# Patient Record
Sex: Male | Born: 1980 | Race: White | Hispanic: No | Marital: Single | Smoking: Former smoker
Health system: Southern US, Community
[De-identification: ages and names within clinical notes are randomized; demographics above are authoritative.]

## PROBLEM LIST (undated history)

## (undated) DIAGNOSIS — R079 Chest pain, unspecified: Secondary | ICD-10-CM

## (undated) DIAGNOSIS — M549 Dorsalgia, unspecified: Secondary | ICD-10-CM

## (undated) DIAGNOSIS — E663 Overweight: Secondary | ICD-10-CM

## (undated) DIAGNOSIS — N182 Chronic kidney disease, stage 2 (mild): Secondary | ICD-10-CM

## (undated) DIAGNOSIS — R0602 Shortness of breath: Secondary | ICD-10-CM

## (undated) HISTORY — DX: Chronic kidney disease, stage 2 (mild): N18.2

## (undated) HISTORY — DX: Overweight: E66.3

## (undated) HISTORY — DX: Dorsalgia, unspecified: M54.9

## (undated) HISTORY — DX: Chest pain, unspecified: R07.9

## (undated) HISTORY — DX: Shortness of breath: R06.02

---

## 2005-08-14 ENCOUNTER — Emergency Department (HOSPITAL_COMMUNITY): Admission: EM | Admit: 2005-08-14 | Discharge: 2005-08-14 | Payer: Self-pay | Admitting: Emergency Medicine

## 2007-02-08 IMAGING — CR DG HAND COMPLETE 3+V*R*
5 series · 5 of 5 positions shown · non-contrast
Comparison: none

CLINICAL DATA: Hand injury.  Hit something.
 RIGHT HAND ? 3 VIEWS:

[view not recorded (1 of 5)]
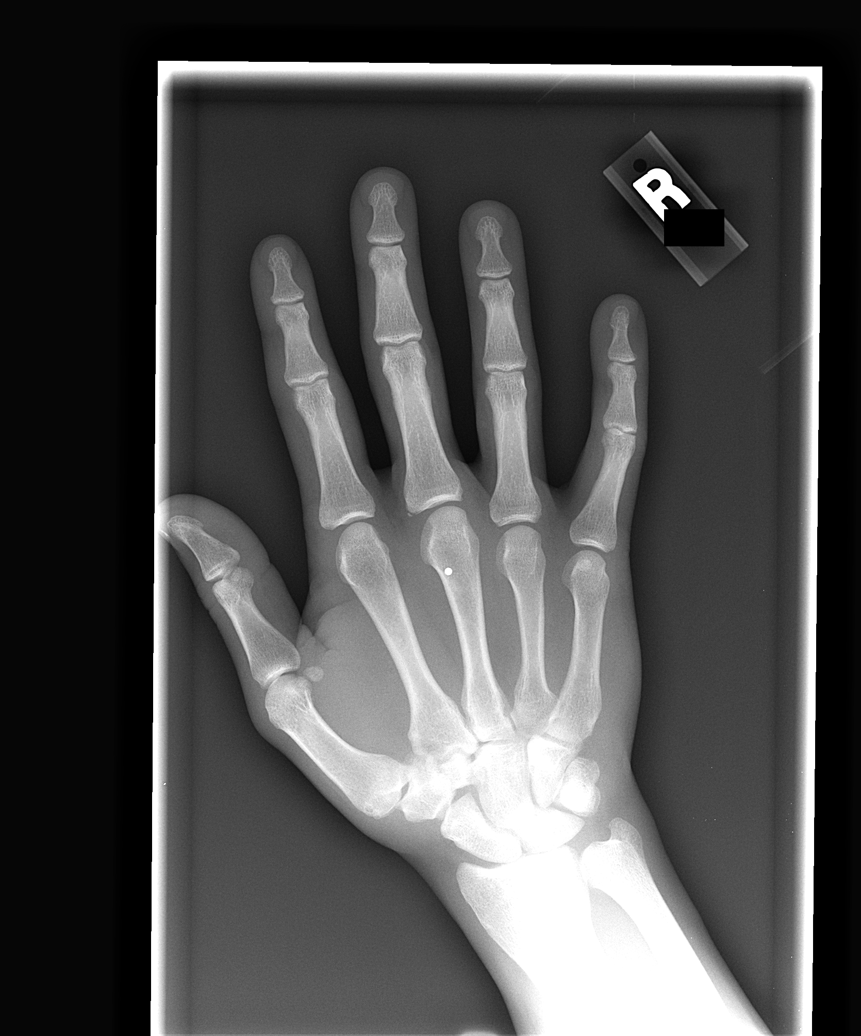

[view not recorded (2 of 5)]
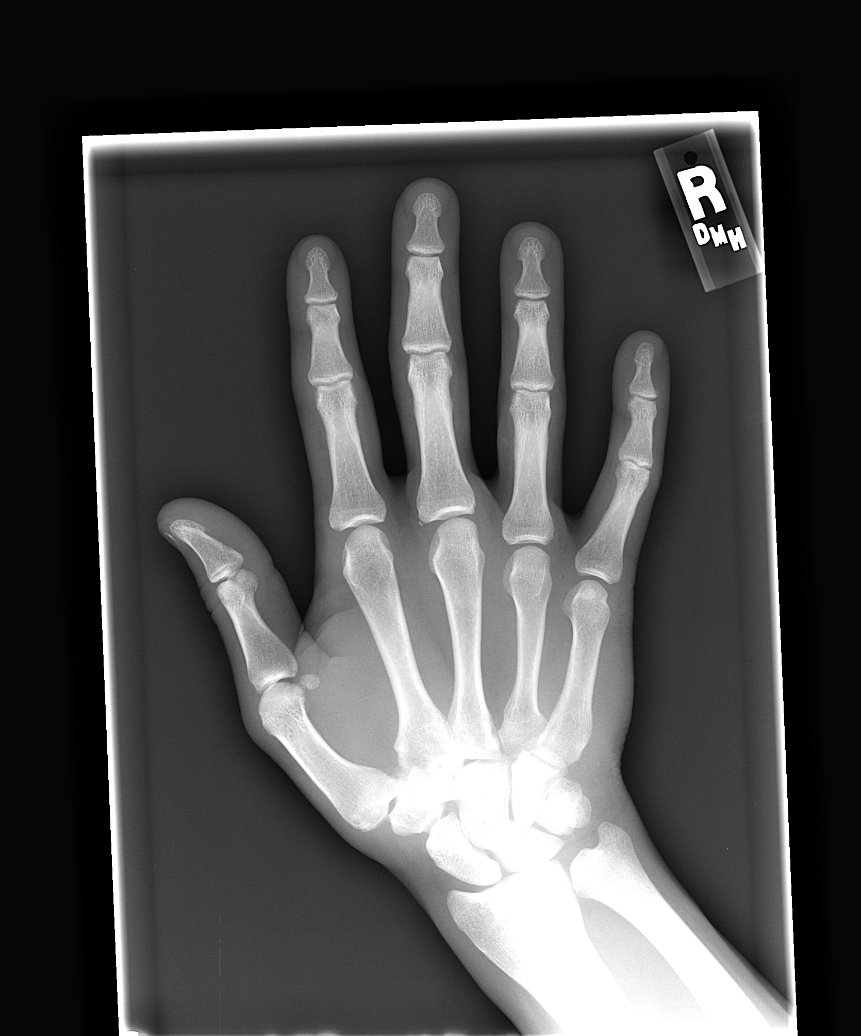

[view not recorded (3 of 5)]
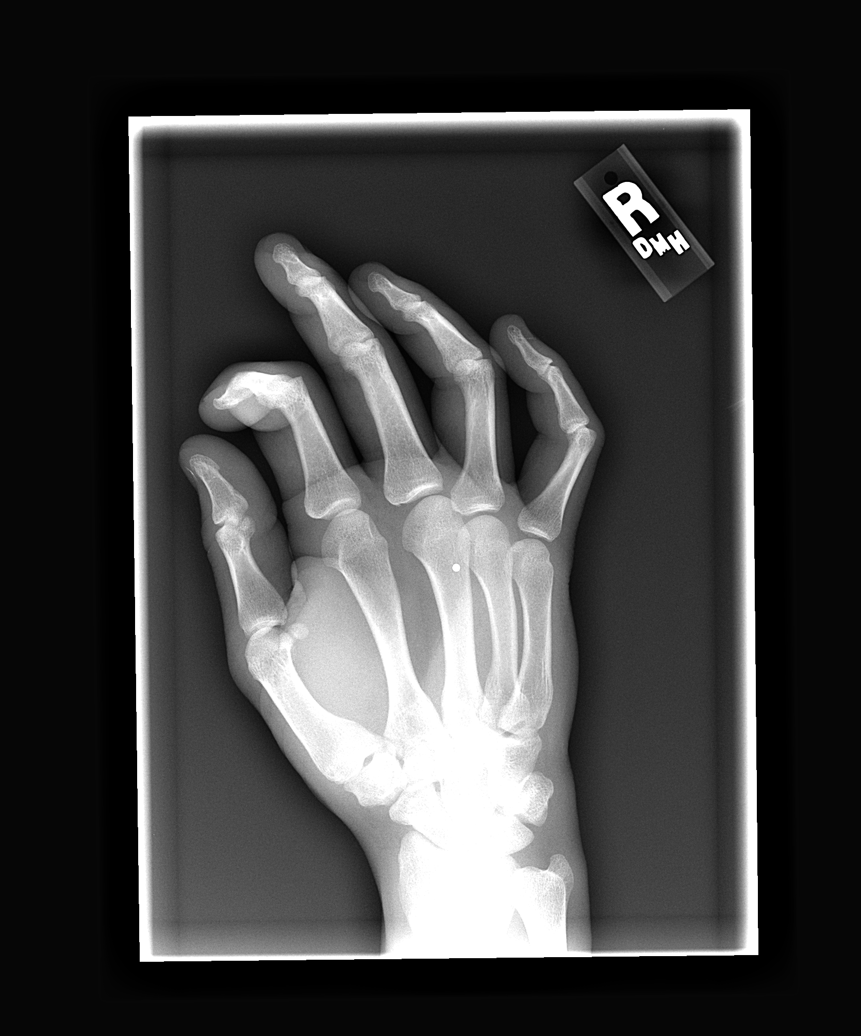

[view not recorded (4 of 5)]
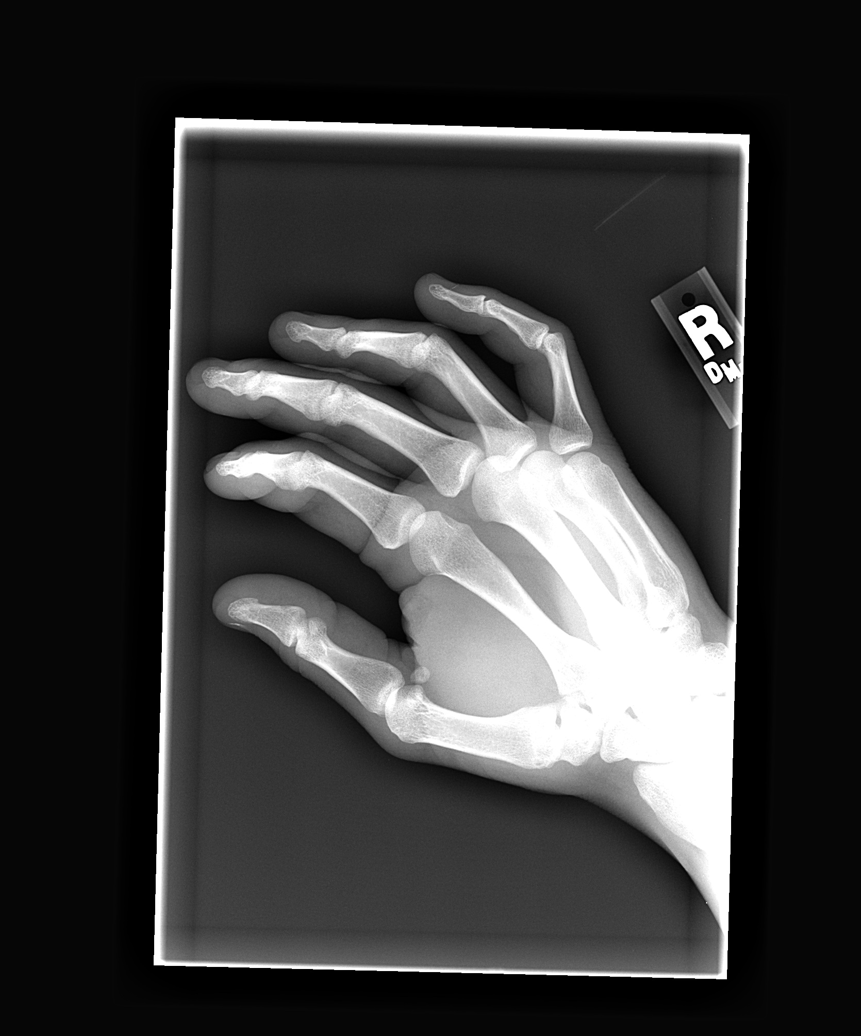

[view not recorded (5 of 5)]
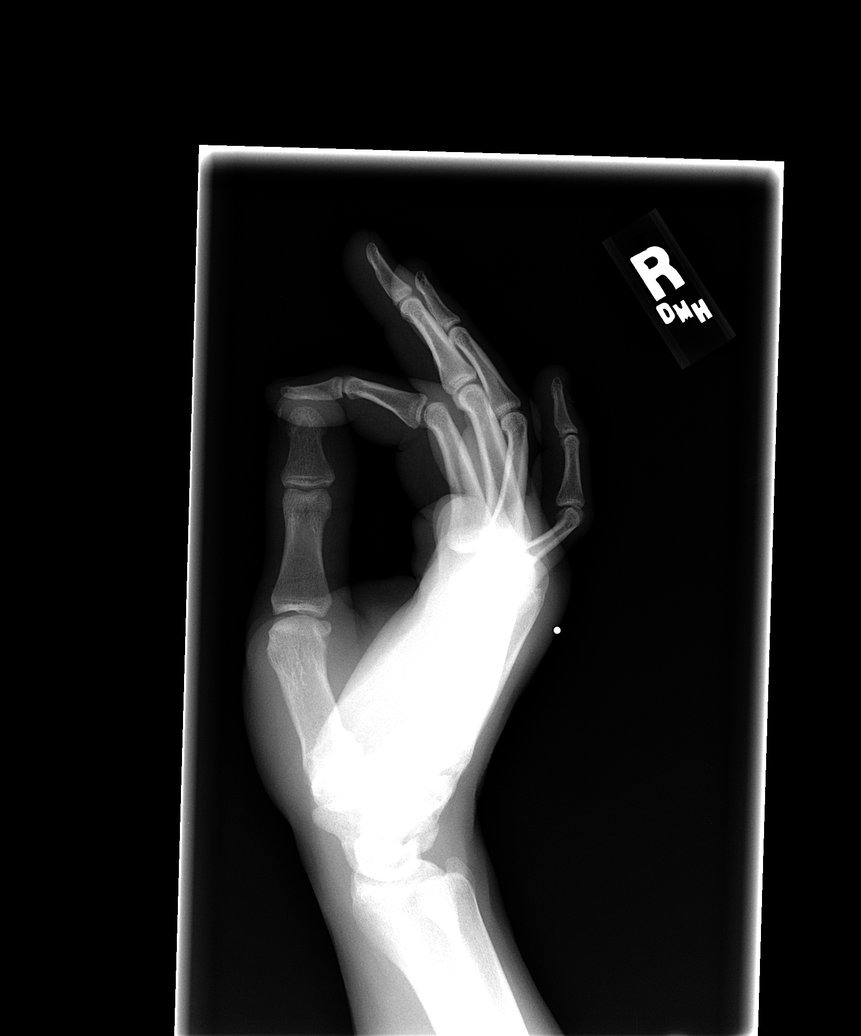

[5 of 5 positions shown; findings below may reference images not displayed]

FINDINGS: There is a small 1 mm avulsion fracture at the base of the third proximal phalanx.  No other fracture is seen.  A BB overlying the third metacarpal is a marker for the study and not a foreign body.
IMPRESSION: Small avulsion fracture at the base of the third proximal phalanx.

## 2021-11-26 DIAGNOSIS — Z789 Other specified health status: Secondary | ICD-10-CM | POA: Insufficient documentation

## 2021-11-26 HISTORY — DX: Other specified health status: Z78.9

## 2021-12-01 ENCOUNTER — Ambulatory Visit: Payer: Self-pay | Admitting: Cardiology

## 2022-03-16 ENCOUNTER — Ambulatory Visit (INDEPENDENT_AMBULATORY_CARE_PROVIDER_SITE_OTHER): Payer: Self-pay | Admitting: Internal Medicine

## 2022-03-16 ENCOUNTER — Encounter: Payer: Self-pay | Admitting: Internal Medicine

## 2022-03-16 ENCOUNTER — Ambulatory Visit (INDEPENDENT_AMBULATORY_CARE_PROVIDER_SITE_OTHER): Payer: Self-pay

## 2022-03-16 VITALS — BP 112/68 | HR 70 | Ht 72.0 in | Wt 216.0 lb

## 2022-03-16 DIAGNOSIS — I493 Ventricular premature depolarization: Secondary | ICD-10-CM

## 2022-03-16 DIAGNOSIS — R030 Elevated blood-pressure reading, without diagnosis of hypertension: Secondary | ICD-10-CM | POA: Insufficient documentation

## 2022-03-16 DIAGNOSIS — R002 Palpitations: Secondary | ICD-10-CM

## 2022-03-16 LAB — TSH: TSH: 2.21 u[IU]/mL (ref 0.450–4.500)

## 2022-03-16 NOTE — Addendum Note (Signed)
Addended by: Macie Burows on: 03/16/2022 10:44 AM   Modules accepted: Orders

## 2022-03-16 NOTE — Progress Notes (Signed)
Cardiology Office Note:    Date:  03/16/2022   ID:  Mark Marquez, DOB [birth year], MRN [id number]  PCP:  Meda Coffee, FNP   [facility] Providers Cardiologist:  Christell Constant, MD     Referring MD: Meda Coffee, FNP   CC: Palpitations Consulted for the evaluation of chest pain at the behest of Ms. Gobush FNP  History of Present Illness:    Mark Marquez is a 41 y.o. 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   Was last feeling well back in 2017 Started having chest pain and fatigue: stress test was normal. Notes that he has had a generalized sick feeling. Has been dealing with migraines; they are getting worse.    Has not had recent chest pain. 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  Notes SOB with activity: walking up a small incline and felt out of breath.  No PND or orthopnea.  No weight gain, leg swelling , or abdominal swelling.  No syncope or near syncope .   Has irregular heart beats on AMB BP cuff that always reads high. Asymptomatic from PVCs  Patient reports prior cardiac testing including negative stress test in 2019; saw Cardiology (Dr. Daleen Squibb) in 2021.   Past Medical History:  Diagnosis Date   Back pain    Chest pain    No pertinent past medical history 11/26/2021   Overweight    SOB (shortness of breath)     History reviewed. No pertinent surgical history.  Current Medications: Current Meds  Medication Sig   [DISCONTINUED] cyclobenzaprine (FLEXERIL) 5 MG tablet Take 5 mg by mouth 3 (three) times daily as needed for muscle spasms.   [DISCONTINUED] meloxicam (MOBIC) 7.5 MG tablet Take 7.5 mg by mouth 2 (two) times daily.     Allergies:   Patient has no known allergies.   Social History   Socioeconomic History   Marital status: Single    Spouse name: Not  on file   Number of children: Not on file   Years of education: Not on file   Highest education level: Not on file  Occupational History   Not on file  Tobacco Use   Smoking status: Former    Types: Cigarettes   Smokeless tobacco: Never  Substance and Sexual Activity   Alcohol use: Not Currently   Drug use: Never   Sexual activity: Yes  Other Topics Concern   Not on file  Social History Narrative   Not on file   Social Determinants of Health   Financial Resource Strain: Not on file  Food Insecurity: Not on file  Transportation Needs: Not on file  Physical Activity: Not on file  Stress: Not on file  Social Connections: Not on file    Social: Works remodeling house; 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     All other systems reviewed and are negative.  EKGs/Labs/Other Studies Reviewed:    The following studies were reviewed today:  EKG:  EKG is  ordered today.  The ekg ordered today demonstrates  03/16/22: SR rate 70, no evidence of inferior infarct;  Recent Labs: No results found for requested labs within last  365 days.  Recent Lipid Panel No results found for: "CHOL", "TRIG", "HDL", "CHOLHDL", "VLDL", "LDLCALC", "LDLDIRECT"  Outside Hospital  or Outside Clinic Studies (OSH):  Date: 03/15/22 LDL 86 Creatinine 1.1      Physical Exam:    VS:  BP 112/68   Pulse 70   Ht 6' (1.829 m)   Wt 216 lb (98 kg)   SpO2 98%   BMI 29.29 kg/m     Wt Readings from Last 3 Encounters:  03/16/22 216 lb (98 kg)    Gen: no distress   Neck: No JVD Cardiac: No Rubs or Gallops, no Murmur, IRIR +2 radial pulses Respiratory: Clear to auscultation bilaterally, normal respiratory rate and effort GI: Soft, nontender, non-distended  MS: No  edema;  moves all extremities Integument: Skin feels warm Neuro:  At time of evaluation, alert and  oriented to person/place/time/situation  Psych: Normal affect, patient feels well  ASSESSMENT:    1. PVC (premature ventricular contraction)   2. Palpitations   3. PVC's (premature ventricular contractions)   4. Elevated blood pressure reading    PLAN:    Palpitations History of PVCs Masked HTN vs migraines and elevated BP reading - suspect he has having at least occasional PVCs; 7 day non live Heart monitor - will get echo; will have him bring his cuff on day of echo to compare BPs - Check TSH - based on results we will offer PRN AV nodal agents or daily meds for BP  APP this winter unless LV dysfunction       Medication Adjustments/Labs and Tests Ordered: Current medicines are reviewed at length with the patient today.  Concerns regarding medicines are outlined above.  Orders Placed This Encounter  Procedures   TSH   LONG TERM MONITOR (3-14 DAYS)   EKG 12-Lead   ECHOCARDIOGRAM COMPLETE   No orders of the defined types were placed in this encounter.   Patient Instructions  Medication Instructions:  Your physician recommends that you continue on your current medications as directed. Please refer to the Current Medication list given to you today.  *If you need a refill on your cardiac medications before your next appointment, please call your pharmacy*   Lab Work: TODAY: TSH If you have labs (blood work) drawn today and your tests are completely normal, you will receive your results only by: MyChart Message (if you have MyChart) OR A paper copy in the mail If you have any lab test that is abnormal or we need to change your treatment, we will call you to review the results.   Testing/Procedures: Your physician has requested that you have an echocardiogram. Echocardiography is a painless test that uses sound waves to create images of your heart. It provides your doctor with information about the size and shape of your heart and how well your heart's chambers and  valves are working. This procedure takes approximately one hour. There are no restrictions for this procedure.    Follow-Up: At [facility], you and your health needs are our priority.  As part of our continuing mission to provide you with exceptional heart care, we have created designated Provider Care Teams.  These Care Teams include your primary Cardiologist (physician) and Advanced Practice Providers (APPs -  Physician Assistants and Nurse Practitioners) who all work together to provide you with the care you need, when you need it.  We recommend signing up for the patient portal called "MyChart".  Sign up information is provided on this After Visit  Summary.  MyChart is used to connect with patients for Virtual Visits (Telemedicine).  Patients are able to view lab/test results, encounter notes, upcoming appointments, etc.  Non-urgent messages can be sent to your provider as well.   To learn more about what you can do with MyChart, go to [email or web address].    Your next appointment:   7 month(s)  The format for your next appointment:   In Person  Provider:   Ronie Spies, PA-C, Jacolyn Reedy, PA-C, or Eligha Bridegroom, NP       Other Instructions Please bring Blood Pressure readings to your Echo appointment for MD to review  Important Information About Sugar         Signed, Christell Constant, MD  03/16/2022 10:27 AM    [facility]

## 2022-03-16 NOTE — Patient Instructions (Addendum)
Medication Instructions:  Your physician recommends that you continue on your current medications as directed. Please refer to the Current Medication list given to you today.  *If you need a refill on your cardiac medications before your next appointment, please call your pharmacy*   Lab Work: TODAY: TSH If you have labs (blood work) drawn today and your tests are completely normal, you will receive your results only by: MyChart Message (if you have MyChart) OR A paper copy in the mail If you have any lab test that is abnormal or we need to change your treatment, we will call you to review the results.   Testing/Procedures: Your physician has requested that you have an echocardiogram. Echocardiography is a painless test that uses sound waves to create images of your heart. It provides your doctor with information about the size and shape of your heart and how well your heart's chambers and valves are working. This procedure takes approximately one hour. There are no restrictions for this procedure.  Your physician has requested that you wear a 7 day heart monitor.     Follow-Up: At [facility], you and your health needs are our priority.  As part of our continuing mission to provide you with exceptional heart care, we have created designated Provider Care Teams.  These Care Teams include your primary Cardiologist (physician) and Advanced Practice Providers (APPs -  Physician Assistants and Nurse Practitioners) who all work together to provide you with the care you need, when you need it.  We recommend signing up for the patient portal called "MyChart".  Sign up information is provided on this After Visit Summary.  MyChart is used to connect with patients for Virtual Visits (Telemedicine).  Patients are able to view lab/test results, encounter notes, upcoming appointments, etc.  Non-urgent messages can be sent to your provider as well.   To learn more about what you can do with MyChart, go  to [email or web address].    Your next appointment:   7 month(s)  The format for your next appointment:   In Person  Provider:   Ronie Spies, PA-C, Mark Reedy, PA-C, or Mark Bridegroom, NP       Other Instructions Please bring Blood Pressure readings to your Echo appointment for MD to review  ZIO XT- Long Term Monitor Instructions  Your physician has requested you wear a ZIO patch monitor for 14 days.  This is a single patch monitor. [employer] supplies one patch monitor per enrollment. Additional stickers are not available. Please do not apply patch if you will be having a Nuclear Stress Test,  Echocardiogram, Cardiac CT, MRI, or Chest Xray during the period you would be wearing the  monitor. The patch cannot be worn during these tests. You cannot remove and re-apply the  ZIO XT patch monitor.  Your ZIO patch monitor will be mailed 3 day USPS to your address on file. It may take 3-5 days  to receive your monitor after you have been enrolled.  Once you have received your monitor, please review the enclosed instructions. Your monitor  has already been registered assigning a specific monitor serial # to you.  Billing and Patient Assistance Program Information  We have supplied [employer] with any of your insurance information on file for billing purposes. [employer] offers a sliding scale Patient Assistance Program for patients that do not have  insurance, or whose insurance does not completely cover the cost of the ZIO monitor.  You must apply for the Patient Assistance Program to  qualify for this discounted rate.  To apply, please call [employer] at [phone], select option 4, select option 2, ask to apply for  Patient Assistance Program. Meredeth Ide will ask your household income, and how many people  are in your household. They will quote your out-of-pocket cost based on that information.  [employer] will also be able to set up a 73-month, interest-free payment plan if needed.  Applying  the monitor   Shave hair from upper left chest.  Hold abrader disc by orange tab. Rub abrader in 40 strokes over the upper left chest as  indicated in your monitor instructions.  Clean area with 4 enclosed alcohol pads. Let dry.  Apply patch as indicated in monitor instructions. Patch will be placed under collarbone on left  side of chest with arrow pointing upward.  Rub patch adhesive wings for 2 minutes. Remove white label marked "1". Remove the white  label marked "2". Rub patch adhesive wings for 2 additional minutes.  While looking in a mirror, press and release button in center of patch. A small green light will  flash 3-4 times. This will be your only indicator that the monitor has been turned on.  Do not shower for the first 24 hours. You may shower after the first 24 hours.  Press the button if you feel a symptom. You will hear a small click. Record Date, Time and  Symptom in the Patient Logbook.  When you are ready to remove the patch, follow instructions on the last 2 pages of Patient  Logbook. Stick patch monitor onto the last page of Patient Logbook.  Place Patient Logbook in the blue and white box. Use locking tab on box and tape box closed  securely. The blue and white box has prepaid postage on it. Please place it in the mailbox as  soon as possible. Your physician should have your test results approximately 7 days after the  monitor has been mailed back to [facility].  Call [facility] Customer Care at [phone] if you have questions regarding  your ZIO XT patch monitor. Call them immediately if you see an orange light blinking on your  monitor.  If your monitor falls off in less than 4 days, contact our Monitor department at [phone].  If your monitor becomes loose or falls off after 4 days call [employer] at [phone] for  suggestions on securing your monitor    Important Information About Sugar

## 2022-03-16 NOTE — Progress Notes (Unsigned)
Enrolled for [employer] to mail a ZIO XT long term holter monitor to the patients address on file.  

## 2022-03-18 ENCOUNTER — Telehealth: Payer: Self-pay | Admitting: Internal Medicine

## 2022-03-18 NOTE — Telephone Encounter (Signed)
Pt aware of lab results ./cy Christell Constant, MD  03/18/2022 10:01 AM EDT     Testing Results WNL.  No change to diagnostic or therapeutic plan.

## 2022-03-18 NOTE — Telephone Encounter (Signed)
Follow Up:     Patient is returning Christine's call from today,concerning  his results.

## 2022-03-22 ENCOUNTER — Telehealth: Payer: Self-pay | Admitting: Licensed Clinical Social Worker

## 2022-03-22 NOTE — Progress Notes (Signed)
Mark Marquez  03/22/2022  Mark Marquez 16-Jul-1981 [id number]  Reason for Referral:  Patient is participating in a Managed Medicaid Plan: No, self pay only Engaged with patient by telephone for initial visit for Mark and Vascular Care Coordination.                                                                                                   Assessment:                    LCSW left pt a message at [phone]. Pt returned my call afterward. I introduced self, role, reason for call. Pt confirmed home address, he sees Mark Marquez at [facility] for primary care and confirmed he is uninsured. He shares that his emergency contact listed Mark Marquez) has passed, he would like to update it as Mark Marquez [phone]. He is currently working, previous job last year he was let go. Denies any specific concerns regarding cost of living. He does have interest in completing Mark Marquez application since he is going to have an echo and has had appts recently in clinic. He understands I will start application for him and then he will need to complete application further. No additional questions at this time.                      HRT/VAS Care Coordination     Patients Home Cardiology Office [facility]   Outpatient Care Team Social Worker   Social Worker Name: Mark Marquez, [place] [facility] [phone]   Living arrangements for the past 2 months Single Family Home   Lives with: Self   Patient Current Insurance Coverage Self-Pay   Patient Has Concern With Paying Medical Bills Yes   Patient Concerns With Medical Bills no insurance, ongoing medical work up   Medical Bill Referrals: [facility]   Does Patient Have Prescription Coverage? No  currently on no medications       Social History:                                                                             SDOH Screenings   Alcohol Screen: Not on file  Depression  (XBM8-4): Not on file  Financial Resource Strain: Medium Risk (03/22/2022)   Overall Financial Resource Strain (CARDIA)    Difficulty of Paying Living Expenses: Somewhat hard  Food Insecurity: No Food Insecurity (03/22/2022)   Hunger Vital Sign    Worried About Running Out of Food in the Last Year: Never true    Ran Out of Food in the Last Year: Never true  Housing: Low Risk  (03/22/2022)   Housing    Last Housing Risk Score: 0  Physical Activity: Not on file  Social Connections: Not  on file  Stress: Not on file  Tobacco Use: Medium Risk (03/16/2022)   Patient History    Smoking Tobacco Use: Former    Smokeless Tobacco Use: Never    Passive Exposure: Not on file  Transportation Needs: No Transportation Needs (03/22/2022)   PRAPARE - Administrator, Civil Service (Medical): No    Lack of Transportation (Non-Medical): No    SDOH Interventions: Financial Resources:  Financial Strain Interventions: Other (Comment) Civil engineer, contracting; [facility]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 Pt aware that I can assist him with completing that as needed and should he have any medications ordered or cost of living concerns that we remain available. I will f/u with pt to ensure it is received.

## 2022-03-23 ENCOUNTER — Ambulatory Visit (HOSPITAL_COMMUNITY): Payer: Self-pay | Attending: Cardiology

## 2022-03-23 DIAGNOSIS — R002 Palpitations: Secondary | ICD-10-CM | POA: Insufficient documentation

## 2022-03-23 DIAGNOSIS — I493 Ventricular premature depolarization: Secondary | ICD-10-CM | POA: Insufficient documentation

## 2022-03-23 LAB — ECHOCARDIOGRAM COMPLETE
Area-P 1/2: 4.49 cm2
S' Lateral: 2.9 cm

## 2022-03-24 ENCOUNTER — Telehealth: Payer: Self-pay

## 2022-03-24 ENCOUNTER — Telehealth: Payer: Self-pay | Admitting: Internal Medicine

## 2022-03-24 NOTE — Telephone Encounter (Signed)
-----   Message from Georgeanna Lea, MD sent at 03/23/2022 12:25 PM EDT ----- Echocardiogram showed preserved left ventricle ejection fraction, overall looks good

## 2022-03-24 NOTE — Telephone Encounter (Signed)
Patient notified of results.

## 2022-03-24 NOTE — Telephone Encounter (Signed)
LM to return my call. 

## 2022-03-24 NOTE — Telephone Encounter (Signed)
Pt returning nurses call regarding results. Please advise 

## 2022-04-06 ENCOUNTER — Telehealth: Payer: Self-pay | Admitting: Licensed Clinical Social Worker

## 2022-04-06 NOTE — Telephone Encounter (Signed)
H&V Care Navigation CSW Progress Note   Clinical Social Worker contacted patient by phone to f/u on assistance applications. No answer again at [phone]. Left oicemail to f/u, I remain available. Will re-attempt as able.    Patient is participating in a Managed Medicaid Plan:  No, self pay only.    SDOH Screenings   Food Insecurity: No Food Insecurity (03/22/2022)  Housing: Low Risk  (03/22/2022)  Transportation Needs: No Transportation Needs (03/22/2022)  Financial Resource Strain: Medium Risk (03/22/2022)  Tobacco Use: Medium Risk (03/16/2022)   Octavio Graves, MSW, LCSW Clinical Social Worker II [facility] Heart/Vascular Care Navigation  [phone]- work cell phone (preferred) [phone]- desk phone

## 2022-04-09 ENCOUNTER — Telehealth: Payer: Self-pay | Admitting: Licensed Clinical Social Worker

## 2022-04-09 NOTE — Telephone Encounter (Signed)
H&V Care Navigation CSW Progress Note  Clinical Social Worker contacted patient by phone to f/u on resources and applications sent. No answer again at [phone]. Left additional voicemail, remain available should pt have any additional questions/concerns for our team.   Patient is participating in a Managed Medicaid Plan:  No, self pay only.  SDOH Screenings   Food Insecurity: No Food Insecurity (03/22/2022)  Housing: Low Risk  (03/22/2022)  Transportation Needs: No Transportation Needs (03/22/2022)  Financial Resource Strain: Medium Risk (03/22/2022)  Tobacco Use: Medium Risk (03/16/2022)    Octavio Graves, MSW, LCSW Clinical Social Worker II [facility] Heart/Vascular Care Navigation  [phone]- work cell phone (preferred) [phone]- desk phone

## 2022-08-13 ENCOUNTER — Telehealth: Payer: Self-pay | Admitting: Internal Medicine

## 2022-08-13 NOTE — Progress Notes (Deleted)
Cardiology Office Note:    Date:  08/13/2022   ID:  Mark Marquez, DOB Nov 17, 1980, MRN [id number]  PCP:  Earna Coder, NP   [place] Providers Cardiologist:  Werner Lean, MD { Click to update primary MD,subspecialty MD or APP then REFRESH:1}    Referring MD: Earna Coder, NP   No chief complaint on file. ***  History of Present Illness:    Mark Marquez is a 42 y.o. male seen as a work in for evaluation of chest pain, dyspnea and fatigue. Followed by Dr Gasper Sells. Last seen in August. Has a history of PVCs. Echo in August was normal. Event monitor showed occasional PVCs. Reportedly had a normal nuclear stress test in 2019- Dr Verl Blalock.   Past Medical History:  Diagnosis Date   Back pain    Chest pain    No pertinent past medical history 11/26/2021   Overweight    SOB (shortness of breath)     No past surgical history on file.  Current Medications: No outpatient medications have been marked as taking for the 08/16/22 encounter (Appointment) with [place], Tarah Buboltz M, MD.     Allergies:   Patient has no known allergies.   Social History   Socioeconomic History   Marital status: Single    Spouse name: Not on file   Number of children: Not on file   Years of education: Not on file   Highest education level: Not on file  Occupational History   Not on file  Tobacco Use   Smoking status: Former    Types: Cigarettes   Smokeless tobacco: Never  Substance and Sexual Activity   Alcohol use: Not Currently   Drug use: Never   Sexual activity: Yes  Other Topics Concern   Not on file  Social History Narrative   Not on file   Social Determinants of Health   Financial Resource Strain: Medium Risk (03/22/2022)   Overall Financial Resource Strain (CARDIA)    Difficulty of Paying Living Expenses: Somewhat hard  Food Insecurity: No Food Insecurity (03/22/2022)   Hunger Vital Sign    Worried About Running Out of Food in the Last Year: Never true    Ran  Out of Food in the Last Year: Never true  Transportation Needs: No Transportation Needs (03/22/2022)   PRAPARE - Hydrologist (Medical): No    Lack of Transportation (Non-Medical): No  Physical Activity: Not on file  Stress: Not on file  Social Connections: Not on file     Family History: The patient's ***family history is not on file.  ROS:   Please see the history of present illness.    *** All other systems reviewed and are negative.  EKGs/Labs/Other Studies Reviewed:    The following studies were reviewed today: Echo 03/23/22: IMPRESSIONS     1. Left ventricular ejection fraction, by estimation, is 55 to 60%. Left  ventricular ejection fraction by 3D volume is 56 %. The left ventricle has  normal function. The left ventricle has no regional wall motion  abnormalities. Left ventricular diastolic   parameters were normal. The average left ventricular global longitudinal  strain is -25.5 %. The global longitudinal strain is normal.   2. Right ventricular systolic function is normal. The right ventricular  size is mildly enlarged. There is normal pulmonary artery systolic  pressure. The estimated right ventricular systolic pressure is [id number] mmHg.   3. The mitral valve is normal in structure. Trivial mitral  valve  regurgitation. No evidence of mitral stenosis.   4. The aortic valve is normal in structure. Aortic valve regurgitation is  not visualized. No aortic stenosis is present.   5. The inferior vena cava is normal in size with greater than 50%  respiratory variability, suggesting right atrial pressure of 3 mmHg.   Event monitor 04/02/22: Study Highlights      Patient had a minimum heart rate of 43 bpm, maximum heart rate of 149 bpm, and average heart rate of 81 bpm.   Predominant underlying rhythm was sinus rhythm.   One run of Paroxsymal SVT lasting 5 beats at longest with a max rate of 144 bpm at fastest.   Isolated PACs were rare (<1.0%).    Isolated PVCs were occasional (2.6%).   No triggered and diary events.   Occasional asymptomatic PVCs.  EKG:  EKG is *** ordered today.  The ekg ordered today demonstrates ***  Recent Labs: 03/16/2022: TSH 2.210  Recent Lipid Panel No results found for: "CHOL", "TRIG", "HDL", "CHOLHDL", "VLDL", "LDLCALC", "LDLDIRECT"  Dated 03/15/22: cholesterol 153, triglycerides 126, HDL 45, LDL 86. CMET normal.  Risk Assessment/Calculations:   {Does this patient have ATRIAL FIBRILLATION?[phone]}  No BP recorded.  {Refresh Note OR Click here to enter BP  :1}***         Physical Exam:    VS:  There were no vitals taken for this visit.    Wt Readings from Last 3 Encounters:  03/16/22 216 lb (98 kg)     GEN: *** Well nourished, well developed in no acute distress HEENT: Normal NECK: No JVD; No carotid bruits LYMPHATICS: No lymphadenopathy CARDIAC: ***RRR, no murmurs, rubs, gallops RESPIRATORY:  Clear to auscultation without rales, wheezing or rhonchi  ABDOMEN: Soft, non-tender, non-distended MUSCULOSKELETAL:  No edema; No deformity  SKIN: Warm and dry NEUROLOGIC:  Alert and oriented x 3 PSYCHIATRIC:  Normal affect   ASSESSMENT:    No diagnosis found. PLAN:    In order of problems listed above:  ***      {Are you ordering a CV Procedure (e.g. stress test, cath, DCCV, TEE, etc)?   Press F2        :[id number]    Medication Adjustments/Labs and Tests Ordered: Current medicines are reviewed at length with the patient today.  Concerns regarding medicines are outlined above.  No orders of the defined types were placed in this encounter.  No orders of the defined types were placed in this encounter.   There are no Patient Instructions on file for this visit.   Signed, Samantha Ragen Martinique, MD  08/13/2022 12:43 PM    [place]

## 2022-08-13 NOTE — Telephone Encounter (Signed)
  Pt c/o of Chest Pain: STAT if CP now or developed within 24 hours  1. Are you having CP right now? No   2. Are you experiencing any other symptoms (ex. SOB, nausea, vomiting, sweating)? Headache and fatigue   3. How long have you been experiencing CP? Yesterday   4. Is your CP continuous or coming and going? Continuing CP   5. Have you taken Nitroglycerin? No   117 sitting  130+ standing  138-139/98-99  Pt and his wife calling, they said, yesterday pt had CP pretty much all day with headache and feeling fatigue

## 2022-08-13 NOTE — Telephone Encounter (Signed)
Pt spouse called [facility] to speak with a nurse.   Pt spouse, Ms. Gae Dry, stated that her husband had symptoms of dyspnea on exertion, chest pain, fatigue and headache yesterday 08/12/2022.  Symptoms started in the morning, and lasted throughout the day yesterday.  Spouse stated she checked his BP and HR throughout the day, and it was 133/94, and HR at highest was between 84-117.  Spouse could NOT convince the Pt to go to the nearest ER. Spouse stated this morning, Pt states he now has NO symptoms.   After educating spouse and Pt on symptomatic cardiac emergencies, spouse wanted an appointment to have her husband assessed by a physician.  [facility] [address] schedule was booked, soonest appointment was with Dr. Martinique, on 08/16/2022 at 25 am.   After a lengthy conversation, I advised Spouse to take Pt or call 911 in the event his symptoms return or worsen, like on 08/12/2022.  They were educated on the severity of not seeking care with these symptoms.  Spouse stated she will take Pt to nearest ER if his symptoms return today or over the weekend.

## 2022-08-16 ENCOUNTER — Ambulatory Visit: Payer: Self-pay | Admitting: Cardiology

## 2022-08-17 ENCOUNTER — Encounter: Payer: Self-pay | Admitting: Physician Assistant

## 2022-08-17 NOTE — Progress Notes (Signed)
Cardiology Office Note:    Date:  08/18/2022   ID:  Mark Marquez, DOB 08/03/1980, MRN [id number]  PCP:  Earna Coder, NP   [place] Providers Cardiologist:  Werner Lean, MD     Referring MD: Earna Coder, NP   Chief Complaint  Patient presents with   Chest Pain    Seen for Dr. Gasper Sells    History of Present Illness:    Mark Marquez is a 42 y.o. male with a hx of PVC's, PAC's, migraines, CKD stage 2 by labs.   He saw Dr. Verl Blalock with [facility] in 2021, states he had a stress test and it was "normal". He was then evaluated by Dr. Gasper Sells on 03/16/2022 for complaints of PVCs and SOB. Echo was ordered which showed EF 55-60%, RV mildly enlarged, normal PA pressures, trivial MR. 7 day zio was arranged and showed predominant rhythm was SR with isolated PACs (<1%) and occasional PVCs (2.6%). He was not felt to require treatment at that time.  His wife called our office triage line on 08/13/2022 and stated he had DOE, chest pain, fatigue, headache on the previous day (08/12/2022).  She had checked his blood pressure and heart rate throughout the day and his blood pressure was 133/94 and his heart rate had been between 84 and 117.  They were advised to go to the ED for further treatment treatment however the patient adamantly refused to go to the emergency department.  An appointment was eventually made with Dr. Martinique for 08/16/2022 however they were advised to proceed to the ED if the symptoms returned or worsened.  He presents today after he had an episode of chest pain on 08/12/2022.  His wife had urged him to go to the ED however he did not feel that it was warranted.  On the 11th, he states he came home from work and he felt exhausted and had a migraine.  He had chest pain for the remainder of the evening that would last for 5 to 10 minutes followed by 2 to 3 minutes of relief.  His chest pain was not relieved by anything, changing positions, rest.  He  states the pains were sharp and dull in nature, and truly hard for him to explain.  During the episodes he denied radiation of pain, denied diaphoresis, denied palpitations, denied shortness of breath.  Today, He denies chest pain, palpitations, dyspnea, pnd, orthopnea, n, v, dizziness, syncope, edema, weight gain, or early satiety.  I do not have a high suspicion that his chest pain is cardiac in nature however given his ongoing symptoms over the last few years and other workups (echo, ZIO) have been unremarkable, he agrees to proceed with a cardiac CTA.   Past Medical History:  Diagnosis Date   Back pain    Chest pain    CKD (chronic kidney disease) stage 2, GFR 60-89 ml/min    No pertinent past medical history 11/26/2021   Overweight    SOB (shortness of breath)     History reviewed. No pertinent surgical history.  Current Medications: Current Meds  Medication Sig   metoprolol tartrate (LOPRESSOR) 50 MG tablet Take 1 tablet (50 mg total) by mouth once for 1 dose. Take 1 hour prior to testing     Allergies:   Patient has no known allergies.   Social History   Socioeconomic History   Marital status: Single    Spouse name: Not on file   Number of children: Not  on file   Years of education: Not on file   Highest education level: Not on file  Occupational History   Not on file  Tobacco Use   Smoking status: Former    Types: Cigarettes   Smokeless tobacco: Never  Substance and Sexual Activity   Alcohol use: Not Currently   Drug use: Never   Sexual activity: Yes  Other Topics Concern   Not on file  Social History Narrative   Not on file   Social Determinants of Health   Financial Resource Strain: Medium Risk (03/22/2022)   Overall Financial Resource Strain (CARDIA)    Difficulty of Paying Living Expenses: Somewhat hard  Food Insecurity: No Food Insecurity (03/22/2022)   Hunger Vital Sign    Worried About Running Out of Food in the Last Year: Never true    Ran Out of  Food in the Last Year: Never true  Transportation Needs: No Transportation Needs (03/22/2022)   PRAPARE - Administrator, Civil Service (Medical): No    Lack of Transportation (Non-Medical): No  Physical Activity: Not on file  Stress: Not on file  Social Connections: Not on file     Family History: The patient's family history is not on file.  ROS:   Please see the history of present illness.     All other systems reviewed and are negative.  EKGs/Labs/Other Studies Reviewed:    The following studies were reviewed today:  04/03/22 7 day Zio -    Patient had a minimum heart rate of 43 bpm, maximum heart rate of 149 bpm, and average heart rate of 81 bpm.   Predominant underlying rhythm was sinus rhythm.   One run of Paroxsymal SVT lasting 5 beats at longest with a max rate of 144 bpm at fastest.   Isolated PACs were rare (<1.0%).   Isolated PVCs were occasional (2.6%).   No triggered and diary events.  03/23/22 echo complete - EF 55-60%, RV mildly enlarged, normal PA pressures, trivial MR   EKG:  EKG is  ordered today.  The ekg ordered today demonstrates SR, HR 60 bpm, consistent with previous EKG tracing.   Recent Labs: 08/18/2022: BUN 14; Creatinine, Ser 1.13; Hemoglobin WILL FOLLOW; Magnesium 2.2; Platelets WILL FOLLOW; Potassium 4.8; Sodium 140; TSH 1.400  Recent Lipid Panel No results found for: "CHOL", "TRIG", "HDL", "CHOLHDL", "VLDL", "LDLCALC", "LDLDIRECT"   Risk Assessment/Calculations:                Physical Exam:    VS:  BP 120/70   Pulse 60   Ht 6' (1.829 m)   Wt 205 lb 12.8 oz (93.4 kg)   SpO2 99%   BMI 27.91 kg/m     Wt Readings from Last 3 Encounters:  08/18/22 205 lb 12.8 oz (93.4 kg)  03/16/22 216 lb (98 kg)     GEN:  Well nourished, well developed in no acute distress HEENT: Normal NECK: No JVD; No carotid bruits LYMPHATICS: No lymphadenopathy CARDIAC: RRR, no murmurs, rubs, gallops RESPIRATORY:  Clear to auscultation without  rales, wheezing or rhonchi  ABDOMEN: Soft, non-tender, non-distended MUSCULOSKELETAL:  No edema; No deformity  SKIN: Warm and dry NEUROLOGIC:  Alert and oriented x 3 PSYCHIATRIC:  Normal affect   ASSESSMENT:    1. Chest pain of uncertain etiology   2. Excessive daytime sleepiness   3. Precordial pain    PLAN:    In order of problems listed above:  Chest pain -reports the incident was  on 08/12/2022, pain was lasting 5-10 minutes for the majority of the evening, pain was sharp and dull in nature, non-radiating. He has had episodes similar in the past. Evaluated by Dr. Daleen Squibb in 2021, had a stress test which was "normal" per pt. EKG shows nonspecific changes though similar to prior. Today he denies chest pain, however he continues to have various concerns related to chest pain and other testing has been unremarkable, although there is not a high level of suspicion that his pain is cardiac in nature, will order coronary CTA for further evaluation.   Coronary CTA, BMET, CBC and TSH ordered. He is not tachycardic, tachypneic, hypoxic or SOB therefore suspicion for VTE is low. PVCs - quiescent at this time. Follow clinically. Can consider BB if palpitations become an issue. Excessive daytime sleepiness - STOP-Bang 4. Wife reports periods of apnea and loud snoring. Also had mild RVE on echo. Will arrange for at home sleep study.   Disposition - coronary CTA, CBC, BMET, TSH, home sleep study. Return in 6 weeks.            Medication Adjustments/Labs and Tests Ordered: Current medicines are reviewed at length with the patient today.  Concerns regarding medicines are outlined above.  Orders Placed This Encounter  Procedures   CT CORONARY MORPH W/CTA COR W/SCORE W/CA W/CM &/OR WO/CM   Basic Metabolic Panel (BMET)   Magnesium   CBC   TSH   Basic Metabolic Panel (BMET)   EKG 12-Lead   Itamar Sleep Study   Meds ordered this encounter  Medications   metoprolol tartrate (LOPRESSOR) 50 MG  tablet    Sig: Take 1 tablet (50 mg total) by mouth once for 1 dose. Take 1 hour prior to testing    Dispense:  1 tablet    Refill:  0    Patient Instructions  Medication Instructions:  Your physician recommends that you continue on your current medications as directed. Please refer to the Current Medication list given to you today. *If you need a refill on your cardiac medications before your next appointment, please call your pharmacy*   Lab Work: TODAY-BMET, MAG, CBC, & TSH BMET 1 WEEK BEFORE TEST If you have labs (blood work) drawn today and your tests are completely normal, you will receive your results only by: MyChart Message (if you have MyChart) OR A paper copy in the mail If you have any lab test that is abnormal or we need to change your treatment, we will call you to review the results.   Testing/Procedures: CORONARY CT   ITAMAR SLEEP STUDY  Follow-Up: At [facility], you and your health needs are our priority.  As part of our continuing mission to provide you with exceptional heart care, we have created designated Provider Care Teams.  These Care Teams include your primary Cardiologist (physician) and Advanced Practice Providers (APPs -  Physician Assistants and Nurse Practitioners) who all work together to provide you with the care you need, when you need it.  We recommend signing up for the patient portal called "MyChart".  Sign up information is provided on this After Visit Summary.  MyChart is used to connect with patients for Virtual Visits (Telemedicine).  Patients are able to view lab/test results, encounter notes, upcoming appointments, etc.  Non-urgent messages can be sent to your provider as well.   To learn more about what you can do with MyChart, go to [email or web address].    Your next appointment:   2 month(s)  Provider:   Melina Copa, PA-C       Other Instructions  INSTRUCTIONS FOR CORONARY CTA Please arrive at the [facility]  entrance of [facility] at (30-45 minutes prior to test start time)  [facility] [place], [place] [address] [phone]  Proceed to the [facility] Radiology Department (First Floor).  Please follow these instructions carefully (unless otherwise directed): PLEASE HAVE LABS - BMP  AT LEAST ONE WEEK PRIOR TO TEST  On the Night Before the Test: Drink plenty of water. Do not consume any caffeinated/decaffeinated beverages or chocolate 12 hours prior to your test. Do not take any antihistamines 12 hours prior to your test.  On the Day of the Test: Drink plenty of water. Do not drink any water within one hour of the test. Do not eat any food 4 hours prior to the test. You may take your regular medications prior to the test. Take 50 mg of Lopressor (Metoprolol) one hour before the test.  After the Test: Drink plenty of water. After receiving IV contrast, you may experience a mild flushed feeling. This is normal. On occasion, you may experience a mild rash up to 24 hours after the test. This is not dangerous. If this occurs, you can take Benadryl 25 mg and increase your fluid intake. If you experience trouble breathing, this can be serious. If it is severe call 911 IMMEDIATELY. If it is mild, please call our office.    Signed, Trudi Ida, NP  08/18/2022 5:24 PM    [place]

## 2022-08-18 ENCOUNTER — Encounter: Payer: Self-pay | Admitting: Cardiology

## 2022-08-18 ENCOUNTER — Ambulatory Visit: Payer: Self-pay | Attending: Cardiology | Admitting: Cardiology

## 2022-08-18 VITALS — BP 120/70 | HR 60 | Ht 72.0 in | Wt 205.8 lb

## 2022-08-18 DIAGNOSIS — R079 Chest pain, unspecified: Secondary | ICD-10-CM

## 2022-08-18 DIAGNOSIS — R072 Precordial pain: Secondary | ICD-10-CM

## 2022-08-18 DIAGNOSIS — I493 Ventricular premature depolarization: Secondary | ICD-10-CM

## 2022-08-18 DIAGNOSIS — G4719 Other hypersomnia: Secondary | ICD-10-CM

## 2022-08-18 MED ORDER — METOPROLOL TARTRATE 50 MG PO TABS: 50.0000 mg | ORAL_TABLET | Freq: Once | ORAL | 0 refills | Status: AC

## 2022-08-18 NOTE — Patient Instructions (Addendum)
Medication Instructions:  Your physician recommends that you continue on your current medications as directed. Please refer to the Current Medication list given to you today. *If you need a refill on your cardiac medications before your next appointment, please call your pharmacy*   Lab Work: TODAY-BMET, MAG, CBC, & TSH BMET 1 WEEK BEFORE TEST If you have labs (blood work) drawn today and your tests are completely normal, you will receive your results only by: [place] (if you have MyChart) OR A paper copy in the mail If you have any lab test that is abnormal or we need to change your treatment, we will call you to review the results.   Testing/Procedures: CORONARY CT   ITAMAR SLEEP STUDY  Follow-Up: At [facility] you and your health needs are our priority.  As part of our continuing mission to provide you with exceptional heart care, we have created designated Provider Care Teams.  These Care Teams include your primary Cardiologist (physician) and Advanced Practice Providers (APPs -  Physician Assistants and Nurse Practitioners) who all work together to provide you with the care you need, when you need it.  We recommend signing up for the patient portal called "MyChart".  Sign up information is provided on this After Visit Summary.  MyChart is used to connect with patients for Virtual Visits (Telemedicine).  Patients are able to view lab/test results, encounter notes, upcoming appointments, etc.  Non-urgent messages can be sent to your provider as well.   To learn more about what you can do with MyChart, go to [email or web address].    Your next appointment:   2 month(s)  Provider:   Melina Copa, PA-C       Other Instructions  INSTRUCTIONS FOR CORONARY CTA Please arrive at the [facility] main entrance of [facility] at (30-45 minutes prior to test start time)  [facility] [place], [place] [address] [phone]  Proceed to the [facility] Radiology Department (First Floor).  Please follow these instructions carefully (unless otherwise directed): PLEASE HAVE LABS - BMP  AT LEAST ONE WEEK PRIOR TO TEST  On the Night Before the Test: Drink plenty of water. Do not consume any caffeinated/decaffeinated beverages or chocolate 12 hours prior to your test. Do not take any antihistamines 12 hours prior to your test.  On the Day of the Test: Drink plenty of water. Do not drink any water within one hour of the test. Do not eat any food 4 hours prior to the test. You may take your regular medications prior to the test. Take 50 mg of Lopressor (Metoprolol) one hour before the test.  After the Test: Drink plenty of water. After receiving IV contrast, you may experience a mild flushed feeling. This is normal. On occasion, you may experience a mild rash up to 24 hours after the test. This is not dangerous. If this occurs, you can take Benadryl 25 mg and increase your fluid intake. If you experience trouble breathing, this can be serious. If it is severe call 911 IMMEDIATELY. If it is mild, please call our office.

## 2022-08-19 ENCOUNTER — Telehealth: Payer: Self-pay

## 2022-08-19 LAB — BASIC METABOLIC PANEL WITH GFR
BUN/Creatinine Ratio: 12 (ref 9–20)
BUN: 14 mg/dL (ref 6–20)
CO2: 24 mmol/L (ref 20–29)
Calcium: 9.9 mg/dL (ref 8.7–10.2)
Chloride: 103 mmol/L (ref 96–106)
Creatinine, Ser: 1.13 mg/dL (ref 0.76–1.27)
Glucose: 93 mg/dL (ref 70–99)
Potassium: 4.8 mmol/L (ref 3.5–5.2)
Sodium: 140 mmol/L (ref 134–144)
eGFR: 85 mL/min/1.73

## 2022-08-19 LAB — CBC
Hematocrit: 46.7 % (ref 37.5–51.0)
Hemoglobin: 15.9 g/dL (ref 13.0–17.7)
MCH: 27.3 pg (ref 26.6–33.0)
MCHC: 34 g/dL (ref 31.5–35.7)
MCV: 80 fL (ref 79–97)
Platelets: 307 x10E3/uL (ref 150–450)
RBC: 5.82 x10E6/uL — ABNORMAL HIGH (ref 4.14–5.80)
RDW: 11.9 % (ref 11.6–15.4)
WBC: 6.8 x10E3/uL (ref 3.4–10.8)

## 2022-08-19 LAB — TSH: TSH: 1.4 u[IU]/mL (ref 0.450–4.500)

## 2022-08-19 LAB — MAGNESIUM: Magnesium: 2.2 mg/dL (ref 1.6–2.3)

## 2022-08-19 NOTE — Telephone Encounter (Signed)
Spoke with patient to review lab results and recommendations from Jennifer Woody, NP. Patient verbalized understanding and had no questions.   

## 2022-08-19 NOTE — Telephone Encounter (Signed)
-----  Message from Trudi Ida, NP sent at 08/19/2022  7:52 AM EST ----- Good morning Mark Marquez,  Your blood work is overall reassuring. Your RBCs were slightly elevated--, this could be related to slight dehydration, increase your water intake.   We did not discuss this yesterday, but your GFR (measurement of kidney function), is slightly decreased. It was 85, which technically means you have stage 2 chronic kidney disease. This is stable from previous results, so no drastic change. Typically, this could be a result of diabetes of high blood pressure, neither of which you have. This will be something your PCP can follow but for now, just watch your salt intake and increase your water intake.  Overall, good results, just wanted you to be aware of the kidney component.   Best, Anderson Malta

## 2022-08-19 NOTE — Telephone Encounter (Signed)
Left message for return call.

## 2022-08-19 NOTE — Telephone Encounter (Signed)
See previous phone note.  

## 2022-08-19 NOTE — Telephone Encounter (Signed)
Patient returning call.

## 2022-08-24 ENCOUNTER — Ambulatory Visit (HOSPITAL_COMMUNITY)
Admission: RE | Admit: 2022-08-24 | Discharge: 2022-08-24 | Disposition: A | Discharge status: Home / Self Care | Payer: Self-pay | Source: Ambulatory Visit | Attending: Cardiology | Admitting: Cardiology

## 2022-08-24 ENCOUNTER — Telehealth: Payer: Self-pay

## 2022-08-24 DIAGNOSIS — R079 Chest pain, unspecified: Secondary | ICD-10-CM

## 2022-08-24 DIAGNOSIS — R072 Precordial pain: Secondary | ICD-10-CM

## 2022-08-24 DIAGNOSIS — G4719 Other hypersomnia: Secondary | ICD-10-CM

## 2022-08-24 MED ORDER — NITROGLYCERIN 0.4 MG SL SUBL
0.8000 mg | SUBLINGUAL_TABLET | Freq: Once | SUBLINGUAL | Status: AC
  Administered 2022-08-24: 0.8 mg via SUBLINGUAL

## 2022-08-24 MED ORDER — NITROGLYCERIN 0.4 MG SL SUBL
SUBLINGUAL_TABLET | SUBLINGUAL | Status: AC
  Filled 2022-08-24: qty 2

## 2022-08-24 MED ORDER — IOHEXOL 350 MG/ML SOLN
100.0000 mL | Freq: Once | INTRAVENOUS | Status: AC | PRN
  Administered 2022-08-24: 100 mL via INTRAVENOUS

## 2022-08-24 NOTE — Telephone Encounter (Signed)
Patient was notified of the results, now the patient will like to move forward with the home sleep study.

## 2022-08-24 NOTE — Telephone Encounter (Signed)
-----  Message from Trudi Ida, NP sent at 08/24/2022  3:48 PM EST ----- Mr. Netherton,  Your coronary CTA was normal, there was no calcium noted in your arteries.This is a good result! Your chest pain does not appear to be related to your heart, also good news.  Overall, good results!  Best, Anderson Malta

## 2022-08-25 ENCOUNTER — Telehealth: Payer: Self-pay | Admitting: *Deleted

## 2022-08-25 NOTE — Telephone Encounter (Signed)
I left a message for the pt to call back and ask to s/w Arbie Cookey (home sleep studies). Pt was seen in the office 08/18/22 by Venia Carbon, NP who ordered an Itamar sleep study. I do not believe this was done while the pt was in the office. I left message to call back to d/w the pt for set up.

## 2022-08-25 NOTE — Telephone Encounter (Signed)
-----  Message from Harold Hedge, [place] sent at 08/18/2022  1:38 PM EST ----- Regarding: itamar sleep study Hello,   A itamar sleep has been ordered for this patient.   Thanks, [place], [place]

## 2022-08-25 NOTE — Telephone Encounter (Signed)
See notes about Itamar sleep study

## 2022-09-13 NOTE — Telephone Encounter (Signed)
Spoke with patient and Per the patient "pt wants to wait until he has other testing first then he will get back to me."

## 2022-09-29 NOTE — Progress Notes (Deleted)
Cardiology Office Note:    Date:  09/29/2022   ID:  Mark Marquez, DOB [birth year], MRN [id number]  PCP:  Earna Coder, NP   [place] Prov[facility] Cardiologist:  Werner Lean, MD     Referring MD: Earna Coder, NP   No chief complaint on file.   History of Present Illness:    Mark Marquez is a 42 y.o. male with a hx of PVC's, PAC's, migraines, CKD stage 2 by labs.  He saw Dr. Verl Blalock with [facility] in 2021, states he had a stress test and it was "normal". He was then evaluated by Dr. Gasper Sells on 03/16/2022 for complaints of PVCs and SOB. Echo was ordered which showed EF 55-60%, RV mildly enlarged, normal PA pressures, trivial MR. 7 day zio was arranged and showed predominant rhythm was SR with isolated PACs (<1%) and occasional PVCs (2.6%). He was not felt to require treatment at that time.  His wife called our office triage line on 08/13/2022 and stated he had DOE, chest pain, fatigue, headache on the previous day (08/12/2022).  She had checked his blood pressure and heart rate throughout the day and his blood pressure was 133/94 and his heart rate had been between 84 and 117.  They were advised to go to the ED for further treatment treatment however the patient adamantly refused to go to the emergency department.  An appointment was eventually made with Dr. Martinique for 08/16/2022 however they were advised to proceed to the ED if the symptoms returned or worsened.  He presented after he had an episode of chest pain on 08/12/2022.  His wife had urged him to go to the ED however he did not feel that it was warranted.  He states he came home from work and he felt exhausted and had a migraine.  He had chest pain for the remainder of the evening that would last for 5 to 10 minutes followed by 2 to 3 minutes of relief.  His chest pain was not relieved by anything, changing positions, rest.  He states the pains were sharp and dull in nature, and truly hard for him to  explain.  During the episodes he denied radiation of pain, denied diaphoresis, denied palpitations, denied shortness of breath. I do not have a high suspicion that his chest pain is cardiac in nature however given his ongoing symptoms over the last few years and other workups (echo, ZIO) have been unremarkable, he agrees to proceed with a cardiac CTA. Itamar was arranged d/t excessive daytime sleepiness.   Cardiac CTA on 08/24/22 revealed coronary calcium score 0, no evidence of CAD.  He presents today for follow up   Past Medical History:  Diagnosis Date   Back pain    Chest pain    CKD (chronic kidney disease) stage 2, GFR 60-89 ml/min    No pertinent past medical history 11/26/2021   Overweight    SOB (shortness of breath)     No past surgical history on file.  Current Medications: No outpatient medications have been marked as taking for the 09/30/22 encounter (Appointment) with Charlie Pitter, PA-C.     Allergies:   Patient has no known allergies.   Social History   Socioeconomic History   Marital status: Single    Spouse name: Not on file   Number of children: Not on file   Years of education: Not on file   Highest education level: Not on file  Occupational History  Not on file  Tobacco Use   Smoking status: Former    Types: Cigarettes   Smokeless tobacco: Never  Substance and Sexual Activity   Alcohol use: Not Currently   Drug use: Never   Sexual activity: Yes  Other Topics Concern   Not on file  Social History Narrative   Not on file   Social Determinants of Health   Financial Resource Strain: Medium Risk (03/22/2022)   Overall Financial Resource Strain (CARDIA)    Difficulty of Paying Living Expenses: Somewhat hard  Food Insecurity: No Food Insecurity (03/22/2022)   Hunger Vital Sign    Worried About Running Out of Food in the Last Year: Never true    Ran Out of Food in the Last Year: Never true  Transportation Needs: No Transportation Needs (03/22/2022)    PRAPARE - Hydrologist (Medical): No    Lack of Transportation (Non-Medical): No  Physical Activity: Not on file  Stress: Not on file  Social Connections: Not on file     Family History: The patient's family history is not on file.  ROS:   Please see the history of present illness.     All other systems reviewed and are negative.  EKGs/Labs/Other Studies Reviewed:    The following studies were reviewed today:  04/03/22 7 day Zio -    Patient had a minimum heart rate of 43 bpm, maximum heart rate of 149 bpm, and average heart rate of 81 bpm.   Predominant underlying rhythm was sinus rhythm.   One run of Paroxsymal SVT lasting 5 beats at longest with a max rate of 144 bpm at fastest.   Isolated PACs were rare (<1.0%).   Isolated PVCs were occasional (2.6%).   No triggered and diary events.  03/23/22 echo complete - EF 55-60%, RV mildly enlarged, normal PA pressures, trivial MR   EKG:  EKG is  ordered today.  The ekg ordered today demonstrates SR, HR 60 bpm, consistent with previous EKG tracing.   Recent Labs: 08/18/2022: BUN 14; Creatinine, Ser 1.13; Hemoglobin 15.9; Magnesium 2.2; Platelets 307; Potassium 4.8; Sodium 140; TSH 1.400  Recent Lipid Panel No results found for: "CHOL", "TRIG", "HDL", "CHOLHDL", "VLDL", "LDLCALC", "LDLDIRECT"   Risk Assessment/Calculations:      No BP recorded.  {Refresh Note OR Click here to enter BP  :1}***    STOP-Bang Score:  4  { Consider Dx Sleep Disordered Breathing or Sleep Apnea  ICD G47.33          :1}     Physical Exam:    VS:  There were no vitals taken for this visit.    Wt Readings from Last 3 Encounters:  08/18/22 205 lb 12.8 oz (93.4 kg)  03/16/22 216 lb (98 kg)     GEN:  Well nourished, well developed in no acute distress HEENT: Normal NECK: No JVD; No carotid bruits LYMPHATICS: No lymphadenopathy CARDIAC: RRR, no murmurs, rubs, gallops RESPIRATORY:  Clear to auscultation without rales,  wheezing or rhonchi  ABDOMEN: Soft, non-tender, non-distended MUSCULOSKELETAL:  No edema; No deformity  SKIN: Warm and dry NEUROLOGIC:  Alert and oriented x 3 PSYCHIATRIC:  Normal affect   ASSESSMENT:    1. Chest pain of uncertain etiology   2. PVC's (premature ventricular contractions)   3. Excessive daytime sleepiness     PLAN:    In order of problems listed above:  Chest pain - PVCs - quiescent at this time. Follow clinically. Can consider  BB if palpitations become an issue. Excessive daytime sleepiness - STOP-Bang 4. Wife reports periods of apnea and loud snoring. Also had mild RVE on echo. Will arrange for at home sleep study.   Disposition - coronary CTA, CBC, BMET, TSH, home sleep study. Return in 6 weeks.            Medication Adjustments/Labs and Tests Ordered: Current medicines are reviewed at length with the patient today.  Concerns regarding medicines are outlined above.  No orders of the defined types were placed in this encounter.  No orders of the defined types were placed in this encounter.   There are no Patient Instructions on file for this visit.   Signed, Trudi Ida, NP  09/29/2022 3:28 PM    [place]

## 2022-09-30 ENCOUNTER — Ambulatory Visit: Payer: Self-pay | Attending: Physician Assistant | Admitting: Cardiology

## 2022-09-30 DIAGNOSIS — G4719 Other hypersomnia: Secondary | ICD-10-CM

## 2022-09-30 DIAGNOSIS — R079 Chest pain, unspecified: Secondary | ICD-10-CM

## 2022-09-30 DIAGNOSIS — I493 Ventricular premature depolarization: Secondary | ICD-10-CM

## 2022-10-20 ENCOUNTER — Ambulatory Visit: Payer: Self-pay | Admitting: Physician Assistant

## 2022-11-04 NOTE — Telephone Encounter (Signed)
I tried to reach the pt about sleep study. I have sent a number of MY CHART messages and left vm for the pt to contact me. Pt has yet to contact me if he is going to proceed or not with the sleep study. I left message to call me back and let me know if he is going to proceed. I will update the ordering provider.

## 2023-03-02 ENCOUNTER — Telehealth: Payer: Self-pay

## 2023-03-02 NOTE — Telephone Encounter (Signed)
**Note De-Identified Mark Marquez Obfuscation** The pt states that he was not given a Itamar-HST device when his test was ordered on 08/18/22.  He states that he does not have insurance coverage at this time but that he is still interested in doing the sleep test if he can afford it.  He is aware that I will call him back once I find out what his cost will be. He thanked me for my call.

## 2023-03-02 NOTE — Telephone Encounter (Signed)
Pt was returning call and is requesting a callback at [phone]. He stated he tried to respond to MyChart messages but for some reasons it'd not allowing him to. Please advise

## 2023-03-02 NOTE — Telephone Encounter (Addendum)
**Note De-Identified [name] Obfuscation** 3rd attempt: We have reached out to the pt concerning the Itamar-HST and device (unclear if he was given one) that was ordered on 08/18/22 by Wallis Bamberg, NP, Omarie Parcell telephone and [facility] messages but have been unable to reach him.  Per phone note in the pts chart from 1/24: September 13, 2022 Reesa Chew, CMA  to Mark Marquez, [facility]      09/13/22  6:28 PM Per the patient pt wants to wait until he has other testing first then he will get back to me."   We also have no current insurance information on him so I called the pt to f/u with him today but got no answer so I left a message asking him to call Larita Fife back at [facility] at [phone].  Forwarding this message to Dr Izora Ribas and Wallis Bamberg, NP for advisement on canceling this Itamar-HST order.

## 2023-03-04 ENCOUNTER — Telehealth (HOSPITAL_BASED_OUTPATIENT_CLINIC_OR_DEPARTMENT_OTHER): Payer: Self-pay | Admitting: Licensed Clinical Social Worker

## 2023-03-04 NOTE — Telephone Encounter (Signed)
Out of pocket cost for Mark Marquez is $754. [facility] Patient Assistance [phone] may be able to assist. Social work has previously mailed resources for those who are uninsured. He can call Rutherford Nail at [phone] to follow up on those resources. Larita Fife - will you please update Mr. Mccroskey?`  He is overdue for follow up. That may be best first step. Scheduling team - please assist.   Rutherford Nail - CCing you as FYI only if he calls  Alver Sorrow, NP

## 2023-03-04 NOTE — Telephone Encounter (Signed)
H&V Care Navigation CSW Progress Note  Clinical Social Worker contacted patient by phone to f/u on assistance applications as pt OOP is high for home sleep study. Previously spoke with pt 04/2022 regarding applications, sent them and was not engaged further w/ f/u calls made. No answer again today at [phone]. Left voicemail to f/u, I remain available. Will re-attempt as able.    Patient is participating in a Managed Medicaid Plan:  No, self pay only.     SDOH Screenings   Food Insecurity: No Food Insecurity (03/22/2022)  Housing: Low Risk  (03/22/2022)  Transportation Needs: No Transportation Needs (03/22/2022)  Financial Resource Strain: Medium Risk (03/22/2022)  Physical Activity: Not at Risk (03/15/2022)   Received from [place], [place]  Social Connections: Not at Risk (03/15/2022)   Received from [place], [place]  Stress: Not at Risk (03/15/2022)   Received from [place], [place]  Tobacco Use: Medium Risk (08/18/2022)   Octavio Graves, MSW, LCSW Clinical Social Worker II [facility] Heart/Vascular Care Navigation  [phone]- work cell phone (preferred) [phone]- desk phone

## 2023-03-07 NOTE — Telephone Encounter (Signed)
**Note De-Identified Maudry Zeidan Obfuscation** No answer so I left a message on the pts VM asking him to call Larita Fife back Dr Meribeth Mattes, NPs office at [facility] at [phone] concerning his home sleep test..

## 2023-03-08 NOTE — Telephone Encounter (Signed)
**Note De-Identified Mark Marquez Obfuscation** No answer-I left a message on the pts VM asking him to call Larita Fife back at Dr Meribeth Mattes, NPs office at [facility] at [phone] concerning his home sleep test.

## 2023-03-09 NOTE — Telephone Encounter (Signed)
**Note De-Identified Stana Bayon Obfuscation** 3rd Attempt: The pt answered my call and we discussed the out of pocket cost (the pt has no insurance at this time) for a Itamar-HST of $754. He states that he cannot afford that cost. I offered him [facility] Patient Assistance phone number as they may be able to help him with cost but he stated that he could not write the number down as he is currently at work. I offered to call him back (I advised him not to answer the call) and leave the [facility] Patient Assistance phone number on his VM and he agreed.  I called his number back and left [facility] Patient Assistance phone number of [phone] and I advised him that if reaches out to them and they are able to assist him to call us back to re-order his Itamar-HST.  I have canceled his Itamar-HST from his active orders.

## 2023-03-18 ENCOUNTER — Telehealth (HOSPITAL_BASED_OUTPATIENT_CLINIC_OR_DEPARTMENT_OTHER): Payer: Self-pay | Admitting: Licensed Clinical Social Worker

## 2023-03-18 NOTE — Telephone Encounter (Signed)
H&V Care Navigation CSW Progress Note  Clinical Social Worker contacted patient by phone to f/u again on assistance applications as pt OOP is high for home sleep study. Previously spoke with pt 04/2022 regarding applications, sent them and was not engaged further w/ f/u calls made. No answer again today at [phone]. Left second voicemail to f/u, I remain available should pt wish to engage further with care navigation team.   Patient is participating in a Managed Medicaid Plan:  No, self pay only.     SDOH Screenings   Food Insecurity: No Food Insecurity (03/22/2022)  Housing: Low Risk  (03/22/2022)  Transportation Needs: No Transportation Needs (03/22/2022)  Financial Resource Strain: Medium Risk (03/22/2022)  Physical Activity: Not on File (03/15/2022)   Received from [facility]  Social Connections: Not at Risk (03/15/2022)   Received from [place], [place]  Stress: Not on File (03/15/2022)   Received from [facility]  Tobacco Use: Medium Risk (08/18/2022)     Octavio Graves, MSW, LCSW Clinical Social Worker II [facility] Heart/Vascular Care Navigation  [phone]- work cell phone (preferred) [phone]- desk phone]

## 2023-10-21 ENCOUNTER — Telehealth: Payer: Self-pay | Admitting: Internal Medicine

## 2023-10-21 NOTE — Telephone Encounter (Signed)
   Pt c/o of Chest Pain: STAT if active CP, including tightness, pressure, jaw pain, radiating pain to shoulder/upper arm/back, CP unrelieved by Nitro. Symptoms reported of SOB, nausea, vomiting, sweating.  1. Are you having CP right now? No     2. Are you experiencing any other symptoms (ex. SOB, nausea, vomiting, sweating)? Shaky, nausea earlier    3. Is your CP continuous or coming and going? Coming and going    4. Have you taken Nitroglycerin? No    5. How long have you been experiencing CP?  Only last night    6. If NO CP at time of call then end call with telling Pt to call back or call 911 if Chest pain returns prior to return call from triage team.

## 2023-10-21 NOTE — Telephone Encounter (Signed)
 Called pt in regards to CP.  Reports had a single episode of CP last night.  Occurred prior to going to bed.  There was a stabbing pain to left side that last about 5-10 minutes.  Went away on its own and went to sleep.  Woke up this morning felt dizzy and shaky. Pt reports no further episodes of CP.  Denies arm, jaw, neck and shoulder pain.  Reports was a little nauseous this morning but nothing major.  BP today 136/87 and 139/75-78.  Pt reports he was told he is pre diabetic; asked pt d/t feeling dizzy and shaky this morning.  Also reports hx of Acid reflux takes OTC medication PRN.  Reports discomfort did not feel like reflux pain.   Cardiac CT 08/24/22 normal.  Advised pt if CP returns and or worrisome to call 911 for ED evaluation.  Will send to MD to advise.

## 2023-10-24 NOTE — Telephone Encounter (Signed)
 Called pt advised of MD response.  Pt had no further questions or concerns.
